# Patient Record
Sex: Female | Born: 1945
Health system: Southern US, Community
[De-identification: ages and names within clinical notes are randomized; demographics above are authoritative.]

## PROBLEM LIST (undated history)

## (undated) DIAGNOSIS — I10 Essential (primary) hypertension: Secondary | ICD-10-CM

## (undated) DIAGNOSIS — M1990 Unspecified osteoarthritis, unspecified site: Secondary | ICD-10-CM

---

## 2013-08-05 ENCOUNTER — Emergency Department (INDEPENDENT_AMBULATORY_CARE_PROVIDER_SITE_OTHER)
Admission: EM | Admit: 2013-08-05 | Discharge: 2013-08-05 | Disposition: A | Discharge status: Home / Self Care | Payer: Self-pay | Source: Home / Self Care | Attending: Family Medicine | Admitting: Family Medicine

## 2013-08-05 ENCOUNTER — Encounter (HOSPITAL_COMMUNITY): Payer: Self-pay | Admitting: Emergency Medicine

## 2013-08-05 DIAGNOSIS — IMO0002 Reserved for concepts with insufficient information to code with codable children: Secondary | ICD-10-CM

## 2013-08-05 DIAGNOSIS — M1710 Unilateral primary osteoarthritis, unspecified knee: Secondary | ICD-10-CM

## 2013-08-05 DIAGNOSIS — M1712 Unilateral primary osteoarthritis, left knee: Secondary | ICD-10-CM

## 2013-08-05 DIAGNOSIS — I10 Essential (primary) hypertension: Secondary | ICD-10-CM

## 2013-08-05 HISTORY — DX: Unspecified osteoarthritis, unspecified site: M19.90

## 2013-08-05 HISTORY — DX: Essential (primary) hypertension: I10

## 2013-08-05 LAB — POCT I-STAT, CHEM 8
BUN: 17 mg/dL (ref 6–23)
CHLORIDE: 101 meq/L (ref 96–112)
Calcium, Ion: 1.25 mmol/L (ref 1.13–1.30)
Creatinine, Ser: 0.9 mg/dL (ref 0.50–1.10)
GLUCOSE: 132 mg/dL — AB (ref 70–99)
HEMATOCRIT: 46 % (ref 36.0–46.0)
HEMOGLOBIN: 15.6 g/dL — AB (ref 12.0–15.0)
POTASSIUM: 3.9 meq/L (ref 3.7–5.3)
Sodium: 140 mEq/L (ref 137–147)
TCO2: 25 mmol/L (ref 0–100)

## 2013-08-05 MED ORDER — AMLODIPINE BESYLATE 10 MG PO TABS: 10.0000 mg | ORAL_TABLET | Freq: Every day | ORAL | Status: DC

## 2013-08-05 MED ORDER — LOSARTAN POTASSIUM-HCTZ 50-12.5 MG PO TABS: 1.0000 | ORAL_TABLET | Freq: Every day | ORAL | Status: DC

## 2013-08-05 MED ORDER — TRAMADOL HCL 50 MG PO TABS: 50.0000 mg | ORAL_TABLET | Freq: Two times a day (BID) | ORAL | Status: DC | PRN

## 2013-08-05 NOTE — ED Provider Notes (Signed)
Pamela Rocha is a 68 y.o. female who presents to Urgent Care today for hypertension and knee pain. Patient is a recent immigrant tinnitus states. She's been here approximately 2 months. She is here today to refill her blood pressure medications and to address her knee pain. 1) hypertension: Patient just ran out of losartan/hydrochlorothiazide 50/12 and amlodipine 10. She tolerated these medications well with no chest pains palpitations or shortness of breath. 2) patient has severe bilateral knee and low back pain. 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 She uses a walker ambulate. She notes inability to extend her knees fully over the last several years. She's tried ibuprofen which has not helped much.   Past Medical History  Diagnosis Date  . Hypertension   . Arthritis    History  Substance Use Topics  . Smoking status: Never Smoker   . Smokeless tobacco: Not on file  . Alcohol Use: Not on file   ROS as above Medications: No current facility-administered medications for this encounter.   Current Outpatient Prescriptions  Medication Sig Dispense Refill  . amLODipine (NORVASC) 10 MG tablet Take 1 tablet (10 mg total) by mouth daily.  30 tablet  1  . losartan-hydrochlorothiazide (HYZAAR) 50-12.5 MG per tablet Take 1 tablet by mouth daily.  30 tablet  1  . traMADol (ULTRAM) 50 MG tablet Take 1 tablet (50 mg total) by mouth every 12 (twelve) hours as needed.  60 tablet  1    Exam:  BP 158/86  Pulse 112  Temp(Src) 97.6 F (36.4 C) (Oral)  Resp 20  SpO2 97% Gen: Well NAD HEENT: EOMI,  MMM Lungs: Normal work of breathing. CTABL Heart: RRR no MRG Abd: NABS, Soft. NT, ND Exts: Brisk capillary refill, warm and well perfused.  Knees: Knees with mild effusion. Range of motion is limited. She lacks 10 of extension bilaterally.  She has pain on range of motion. Nontender.   Results for orders placed during the hospital encounter of 08/05/13  (from the past 24 hour(s))  POCT I-STAT, CHEM 8     Status: Abnormal   Collection Time    08/05/13 10:52 AM      Result Value Ref Range   Sodium 140  137 - 147 mEq/L   Potassium 3.9  3.7 - 5.3 mEq/L   Chloride 101  96 - 112 mEq/L   BUN 17  6 - 23 mg/dL   Creatinine, Ser 5.400.90  0.50 - 1.10 mg/dL   Glucose, Bld 981132 (*) 70 - 99 mg/dL   Calcium, Ion 1.911.25  4.781.13 - 1.30 mmol/L   TCO2 25  0 - 100 mmol/L   Hemoglobin 15.6 (*) 12.0 - 15.0 g/dL   HCT 29.546.0  62.136.0 - 30.846.0 %   No results found.  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 1) hypertension: Refill Hyzaar and amlodipine.  2) knee DJD: Quite significant appearing. No x-rays at this time. Patient has loss of range of motion. But his tramadol. Encourage followup with primary care provider in orthopedics for this issue. [facility]  Discussed warning signs or symptoms. Please see discharge instructions. Patient expresses understanding.    Rodolph BongEvan S Corey, MD 08/05/13 [phone]

## 2013-08-05 NOTE — Discharge Instructions (Signed)
Thank you for coming in today. Restart blood pressure medications.  Use tramadol sparingly for severe pain.  Schedule Tylenol 2 pills every 6 hours for pain.  Followup with primary care Dr. Soon.   Please call or see Ms Antionette CharMaggy Mena for assistance with your bill.  You may qualify for reduced or free services.  Her phone number is [phone]. Her email is [email or web address]    Arterial Hypertension Arterial hypertension (high blood pressure) is a condition of elevated pressure in your blood vessels. Hypertension over a long period of time is a risk factor for strokes, heart attacks, and heart failure. It is also the leading cause of kidney (renal) failure.  CAUSES   In Adults -- Over 90% of all hypertension has no known cause. This is called essential or primary hypertension. In the other 10% of people with hypertension, the increase in blood pressure is caused by another disorder. This is called secondary hypertension. Important causes of secondary hypertension are:  Heavy alcohol use.  Obstructive sleep apnea.  Hyperaldosterosim (Conn's syndrome).  Steroid use.  Chronic kidney failure.  Hyperparathyroidism.  Medications.  Renal artery stenosis.  Pheochromocytoma.  Cushing's disease.  Coarctation of the aorta.  Scleroderma renal crisis.  Licorice (in excessive amounts).  Drugs (cocaine, methamphetamine). Your caregiver can explain any items above that apply to you.  In Children -- Secondary hypertension is more common and should always be considered.  Pregnancy -- Few women of childbearing age have high blood pressure. However, up to 10% of them develop hypertension of pregnancy. Generally, this will not harm the woman. It may be a sign of 3 complications of pregnancy: preeclampsia, HELLP syndrome, and eclampsia. Follow up and control with medication is necessary. SYMPTOMS   This condition normally does not produce any noticeable symptoms. It is  usually found during a routine exam.  Malignant hypertension is a late problem of high blood pressure. It may have the following symptoms:  Headaches.  Blurred vision.  End-organ damage (this means your kidneys, heart, lungs, and other organs are being damaged).  Stressful situations can increase the blood pressure. If a person with normal blood pressure has their blood pressure go up while being seen by their caregiver, this is often termed "white coat hypertension." Its importance is not known. It may be related with eventually developing hypertension or complications of hypertension.  Hypertension is often confused with mental tension, stress, and anxiety. DIAGNOSIS  The diagnosis is made by 3 separate blood pressure measurements. They are taken at least 1 week apart from each other. If there is organ damage from hypertension, the diagnosis may be made without repeat measurements. Hypertension is usually identified by having blood pressure readings:  Above 140/90 mmHg measured in both arms, at 3 separate times, over a couple weeks.  Over 130/80 mmHg should be considered a risk factor and may require treatment in patients with diabetes. Blood pressure readings over 120/80 mmHg are called "pre-hypertension" even in non-diabetic patients. To get a true blood pressure measurement, use the following guidelines. Be aware of the factors that can alter blood pressure readings.  Take measurements at least 1 hour after caffeine.  Take measurements 30 minutes after smoking and without any stress. This is another reason to quit smoking  it raises your blood pressure.  Use a proper cuff size. Ask your caregiver if you are not sure about your cuff size.  Most home blood pressure cuffs are automatic. They will measure systolic and diastolic pressures. The systolic pressure is  the pressure reading at the start of sounds. Diastolic pressure is the pressure at which the sounds disappear. If you are  elderly, measure pressures in multiple postures. Try sitting, lying or standing.  Sit at rest for a minimum of 5 minutes before taking measurements.  You should not be on any medications like decongestants. These are found in many cold medications.  Record your blood pressure readings and review them with your caregiver. If you have hypertension:  Your caregiver may do tests to be sure you do not have secondary hypertension (see "causes" above).  Your caregiver may also look for signs of metabolic syndrome. This is also called Syndrome X or Insulin Resistance Syndrome. You may have this syndrome if you have type 2 diabetes, abdominal obesity, and abnormal blood lipids in addition to hypertension.  Your caregiver will take your medical and family history and perform a physical exam.  Diagnostic tests may include blood tests (for glucose, cholesterol, potassium, and kidney function), a urinalysis, or an EKG. Other tests may also be necessary depending on your condition. PREVENTION  There are important lifestyle issues that you can adopt to reduce your chance of developing hypertension:  Maintain a normal weight.  Limit the amount of salt (sodium) in your diet.  Exercise often.  Limit alcohol intake.  Get enough potassium in your diet. Discuss specific advice with your caregiver.  Follow a DASH diet (dietary approaches to stop hypertension). This diet is rich in fruits, vegetables, and low-fat dairy products, and avoids certain fats. PROGNOSIS  Essential hypertension cannot be cured. Lifestyle changes and medical treatment can lower blood pressure and reduce complications. The prognosis of secondary hypertension depends on the underlying cause. Many people whose hypertension is controlled with medicine or lifestyle changes can live a normal, healthy life.  RISKS AND COMPLICATIONS  While high blood pressure alone is not an illness, it often requires treatment due to its short- and  long-term effects on many organs. Hypertension increases your risk for:  CVAs or strokes (cerebrovascular accident).  Heart failure due to chronically high blood pressure (hypertensive cardiomyopathy).  Heart attack (myocardial infarction).  Damage to the retina (hypertensive retinopathy).  Kidney failure (hypertensive nephropathy). Your caregiver can explain list items above that apply to you. Treatment of hypertension can significantly reduce the risk of complications. TREATMENT   For overweight patients, weight loss and regular exercise are recommended. Physical fitness lowers blood pressure.  Mild hypertension is usually treated with diet and exercise. A diet rich in fruits and vegetables, fat-free dairy products, and foods low in fat and salt (sodium) can help lower blood pressure. Decreasing salt intake decreases blood pressure in a 1/3 of people.  Stop smoking if you are a smoker. The steps above are highly effective in reducing blood pressure. While these actions are easy to suggest, they are difficult to achieve. Most patients with moderate or severe hypertension end up requiring medications to bring their blood pressure down to a normal level. There are several classes of medications for treatment. Blood pressure pills (antihypertensives) will lower blood pressure by their different actions. Lowering the blood pressure by 10 mmHg may decrease the risk of complications by as much as 25%. The goal of treatment is effective blood pressure control. This will reduce your risk for complications. Your caregiver will help you determine the best treatment for you according to your lifestyle. What is excellent treatment for one person, may not be for you. HOME CARE INSTRUCTIONS   Do not smoke.  Follow  the lifestyle changes outlined in the "Prevention" section.  If you are on medications, follow the directions carefully. Blood pressure medications must be taken as prescribed. Skipping doses  reduces their benefit. It also puts you at risk for problems.  Follow up with your caregiver, as directed.  If you are asked to monitor your blood pressure at home, follow the guidelines in the "Diagnosis" section above. SEEK MEDICAL CARE IF:   You think you are having medication side effects.  You have recurrent headaches or lightheadedness.  You have swelling in your ankles.  You have trouble with your vision. SEEK IMMEDIATE MEDICAL CARE IF:   You have sudden onset of chest pain or pressure, difficulty breathing, or other symptoms of a heart attack.  You have a severe headache.  You have symptoms of a stroke (such as sudden weakness, difficulty speaking, difficulty walking). MAKE SURE YOU:   Understand these instructions.  Will watch your condition.  Will get help right away if you are not doing well or get worse. Document Released: 04/01/2005 Document Revised: 06/24/2011 Document Reviewed: 10/30/2006 [facility] Patient Information 2014 [place], [place].  Arthritis, Nonspecific Arthritis is inflammation of a joint. This usually means pain, redness, warmth or swelling are present. One or more joints may be involved. There are a number of types of arthritis. Your caregiver may not be able to tell what type of arthritis you have right away. CAUSES  The most common cause of arthritis is the wear and tear on the joint (osteoarthritis). This causes damage to the cartilage, which can break down over time. The knees, hips, back and neck are most often affected by this type of arthritis. Other types of arthritis and common causes of joint pain include:  Sprains and other injuries near the joint. Sometimes minor sprains and injuries cause pain and swelling that develop hours later.  Rheumatoid arthritis. This affects hands, feet and knees. It usually affects both sides of your body at the same time. It is often associated with chronic ailments, fever, weight loss and general  weakness.  Crystal arthritis. Gout and pseudo gout can cause occasional acute severe pain, redness and swelling in the foot, ankle, or knee.  Infectious arthritis. Bacteria can get into a joint through a break in overlying skin. This can cause infection of the joint. Bacteria and viruses can also spread through the blood and affect your joints.  Drug, infectious and allergy reactions. Sometimes joints can become mildly painful and slightly swollen with these types of illnesses. SYMPTOMS   Pain is the main symptom.  Your joint or joints can also be red, swollen and warm or hot to the touch.  You may have a fever with certain types of arthritis, or even feel overall ill.  The joint with arthritis will hurt with movement. Stiffness is present with some types of arthritis. DIAGNOSIS  Your caregiver will suspect arthritis based on your description of your symptoms and on your exam. Testing may be needed to find the type of arthritis:  Blood and sometimes urine tests.  X-ray tests and sometimes CT or MRI scans.  Removal of fluid from the joint (arthrocentesis) is done to check for bacteria, crystals or other causes. Your caregiver (or a specialist) will numb the area over the joint with a local anesthetic, and use a needle to remove joint fluid for examination. This procedure is only minimally uncomfortable.  Even with these tests, your caregiver may not be able to tell what kind of arthritis you have.  Consultation with a specialist (rheumatologist) may be helpful. TREATMENT  Your caregiver will discuss with you treatment specific to your type of arthritis. If the specific type cannot be determined, then the following general recommendations may apply. Treatment of severe joint pain includes:  Rest.  Elevation.  Anti-inflammatory medication (for example, ibuprofen) may be prescribed. Avoiding activities that cause increased pain.  Only take over-the-counter or prescription medicines for  pain and discomfort as recommended by your caregiver.  Cold packs over an inflamed joint may be used for 10 to 15 minutes every hour. Hot packs sometimes feel better, but do not use overnight. Do not use hot packs if you are diabetic without your caregiver's permission.  A cortisone shot into arthritic joints may help reduce pain and swelling.  Any acute arthritis that gets worse over the next 1 to 2 days needs to be looked at to be sure there is no joint infection. Long-term arthritis treatment involves modifying activities and lifestyle to reduce joint stress jarring. This can include weight loss. Also, exercise is needed to nourish the joint cartilage and remove waste. This helps keep the muscles around the joint strong. HOME CARE INSTRUCTIONS   Do not take aspirin to relieve pain if gout is suspected. This elevates uric acid levels.  Only take over-the-counter or prescription medicines for pain, discomfort or fever as directed by your caregiver.  Rest the joint as much as possible.  If your joint is swollen, keep it elevated.  Use crutches if the painful joint is in your leg.  Drinking plenty of fluids may help for certain types of arthritis.  Follow your caregiver's dietary instructions.  Try low-impact exercise such as:  Swimming.  Water aerobics.  Biking.  Walking.  Morning stiffness is often relieved by a warm shower.  Put your joints through regular range-of-motion. SEEK MEDICAL CARE IF:   You do not feel better in 24 hours or are getting worse.  You have side effects to medications, or are not getting better with treatment. SEEK IMMEDIATE MEDICAL CARE IF:   You have a fever.  You develop severe joint pain, swelling or redness.  Many joints are involved and become painful and swollen.  There is severe back pain and/or leg weakness.  You have loss of bowel or bladder control. Document Released: 05/09/2004 Document Revised: 06/24/2011 Document Reviewed:  05/25/2008 [facility] Patient Information 2014 [place], [place].

## 2013-10-04 ENCOUNTER — Encounter (HOSPITAL_COMMUNITY): Payer: Self-pay | Admitting: Emergency Medicine

## 2013-10-04 ENCOUNTER — Emergency Department (HOSPITAL_COMMUNITY): Payer: Self-pay

## 2013-10-04 ENCOUNTER — Emergency Department (HOSPITAL_COMMUNITY)
Admission: EM | Admit: 2013-10-04 | Discharge: 2013-10-04 | Disposition: A | Discharge status: Home / Self Care | Payer: Self-pay | Attending: Emergency Medicine | Admitting: Emergency Medicine

## 2013-10-04 DIAGNOSIS — M1712 Unilateral primary osteoarthritis, left knee: Secondary | ICD-10-CM

## 2013-10-04 DIAGNOSIS — M25562 Pain in left knee: Secondary | ICD-10-CM

## 2013-10-04 DIAGNOSIS — G8929 Other chronic pain: Secondary | ICD-10-CM | POA: Insufficient documentation

## 2013-10-04 DIAGNOSIS — M549 Dorsalgia, unspecified: Secondary | ICD-10-CM | POA: Insufficient documentation

## 2013-10-04 DIAGNOSIS — M5489 Other dorsalgia: Secondary | ICD-10-CM

## 2013-10-04 DIAGNOSIS — M1731 Unilateral post-traumatic osteoarthritis, right knee: Secondary | ICD-10-CM

## 2013-10-04 DIAGNOSIS — Z79899 Other long term (current) drug therapy: Secondary | ICD-10-CM | POA: Insufficient documentation

## 2013-10-04 DIAGNOSIS — IMO0002 Reserved for concepts with insufficient information to code with codable children: Secondary | ICD-10-CM | POA: Insufficient documentation

## 2013-10-04 DIAGNOSIS — G8911 Acute pain due to trauma: Secondary | ICD-10-CM | POA: Insufficient documentation

## 2013-10-04 DIAGNOSIS — I10 Essential (primary) hypertension: Secondary | ICD-10-CM | POA: Insufficient documentation

## 2013-10-04 DIAGNOSIS — M1710 Unilateral primary osteoarthritis, unspecified knee: Secondary | ICD-10-CM | POA: Insufficient documentation

## 2013-10-04 DIAGNOSIS — M25561 Pain in right knee: Secondary | ICD-10-CM

## 2013-10-04 MED ORDER — HYDROCODONE-ACETAMINOPHEN 5-325 MG PO TABS: 1.0000 | ORAL_TABLET | ORAL | Status: DC | PRN

## 2013-10-04 MED ORDER — LOSARTAN POTASSIUM 50 MG PO TABS
50.0000 mg | ORAL_TABLET | Freq: Once | ORAL | Status: AC
  Administered 2013-10-04: 50 mg via ORAL
  Filled 2013-10-04 (×2): qty 1

## 2013-10-04 MED ORDER — HYDROCODONE-ACETAMINOPHEN 5-325 MG PO TABS
1.0000 | ORAL_TABLET | Freq: Once | ORAL | Status: AC
  Administered 2013-10-04: 1 via ORAL
  Filled 2013-10-04: qty 1

## 2013-10-04 MED ORDER — LOSARTAN POTASSIUM-HCTZ 50-12.5 MG PO TABS: 1.0000 | ORAL_TABLET | Freq: Every day | ORAL | Status: DC

## 2013-10-04 MED ORDER — HYDROCHLOROTHIAZIDE 12.5 MG PO CAPS
12.5000 mg | ORAL_CAPSULE | Freq: Every day | ORAL | Status: DC
  Administered 2013-10-04: 12.5 mg via ORAL
  Filled 2013-10-04: qty 1

## 2013-10-04 MED ORDER — AMLODIPINE BESYLATE 10 MG PO TABS: 10.0000 mg | ORAL_TABLET | Freq: Every day | ORAL | Status: DC

## 2013-10-04 MED ORDER — AMLODIPINE BESYLATE 5 MG PO TABS
5.0000 mg | ORAL_TABLET | Freq: Every day | ORAL | Status: DC
  Administered 2013-10-04: 5 mg via ORAL
  Filled 2013-10-04: qty 1

## 2013-10-04 NOTE — Discharge Instructions (Signed)
Arthritis, Nonspecific °Arthritis is inflammation of a joint. This usually means pain, redness, warmth or swelling are present. One or more joints may be involved. There are a number of types of arthritis. Your caregiver may not be able to tell what type of arthritis you have right away. °CAUSES  °The most common cause of arthritis is the wear and tear on the joint (osteoarthritis). This causes damage to the cartilage, which can break down over time. The knees, hips, back and neck are most often affected by this type of arthritis. °Other types of arthritis and common causes of joint pain include: °· Sprains and other injuries near the joint. Sometimes minor sprains and injuries cause pain and swelling that develop hours later. °· Rheumatoid arthritis. This affects hands, feet and knees. It usually affects both sides of your body at the same time. It is often associated with chronic ailments, fever, weight loss and general weakness. °· Crystal arthritis. Gout and pseudo gout can cause occasional acute severe pain, redness and swelling in the foot, ankle, or knee. °· Infectious arthritis. Bacteria can get into a joint through a break in overlying skin. This can cause infection of the joint. Bacteria and viruses can also spread through the blood and affect your joints. °· Drug, infectious and allergy reactions. Sometimes joints can become mildly painful and slightly swollen with these types of illnesses. °SYMPTOMS  °· Pain is the main symptom. °· Your joint or joints can also be red, swollen and warm or hot to the touch. °· You may have a fever with certain types of arthritis, or even feel overall ill. °· The joint with arthritis will hurt with movement. Stiffness is present with some types of arthritis. °DIAGNOSIS  °Your caregiver will suspect arthritis based on your description of your symptoms and on your exam. Testing may be needed to find the type of arthritis: °· Blood and sometimes urine tests. °· X-ray tests  and sometimes CT or MRI scans. °· Removal of fluid from the joint (arthrocentesis) is done to check for bacteria, crystals or other causes. Your caregiver (or a specialist) will numb the area over the joint with a local anesthetic, and use a needle to remove joint fluid for examination. This procedure is only minimally uncomfortable. °· Even with these tests, your caregiver may not be able to tell what kind of arthritis you have. Consultation with a specialist (rheumatologist) may be helpful. °TREATMENT  °Your caregiver will discuss with you treatment specific to your type of arthritis. If the specific type cannot be determined, then the following general recommendations may apply. °Treatment of severe joint pain includes: °· Rest. °· Elevation. °· Anti-inflammatory medication (for example, ibuprofen) may be prescribed. Avoiding activities that cause increased pain. °· Only take over-the-counter or prescription medicines for pain and discomfort as recommended by your caregiver. °· Cold packs over an inflamed joint may be used for 10 to 15 minutes every hour. Hot packs sometimes feel better, but do not use overnight. Do not use hot packs if you are diabetic without your caregiver's permission. °· A cortisone shot into arthritic joints may help reduce pain and swelling. °· Any acute arthritis that gets worse over the next 1 to 2 days needs to be looked at to be sure there is no joint infection. °Long-term arthritis treatment involves modifying activities and lifestyle to reduce joint stress jarring. This can include weight loss. Also, exercise is needed to nourish the joint cartilage and remove waste. This helps keep the muscles   around the joint strong. °HOME CARE INSTRUCTIONS  °· Do not take aspirin to relieve pain if gout is suspected. This elevates uric acid levels. °· Only take over-the-counter or prescription medicines for pain, discomfort or fever as directed by your caregiver. °· Rest the joint as much as  possible. °· If your joint is swollen, keep it elevated. °· Use crutches if the painful joint is in your leg. °· Drinking plenty of fluids may help for certain types of arthritis. °· Follow your caregiver's dietary instructions. °· Try low-impact exercise such as: °¨ Swimming. °¨ Water aerobics. °¨ Biking. °¨ Walking. °· Morning stiffness is often relieved by a warm shower. °· Put your joints through regular range-of-motion. °SEEK MEDICAL CARE IF:  °· You do not feel better in 24 hours or are getting worse. °· You have side effects to medications, or are not getting better with treatment. °SEEK IMMEDIATE MEDICAL CARE IF:  °· You have a fever. °· You develop severe joint pain, swelling or redness. °· Many joints are involved and become painful and swollen. °· There is severe back pain and/or leg weakness. °· You have loss of bowel or bladder control. °Document Released: 05/09/2004 Document Revised: 06/24/2011 Document Reviewed: 05/25/2008 °[employer]® Patient Information ©2015 [employer]. This information is not intended to replace advice given to you by your health care provider. Make sure you discuss any questions you have with your health care provider. ° °

## 2013-10-04 NOTE — ED Notes (Signed)
Pt here for back pain , hx of same and hx of arthritis, per daughter with pt has difficulty walking and getting out of bed,

## 2013-10-04 NOTE — ED Provider Notes (Signed)
CSN: [id number]     Arrival date & time 10/04/13  1029 History   First MD Initiated Contact with Patient 10/04/13 1129     Chief Complaint  Patient presents with  . Back Pain     (Consider location/radiation/quality/duration/timing/severity/associated sxs/prior Treatment) HPI  Pamela Rocha is a 68 y.o. female who is here for worsening of chronic back pain. Worsening. Pain has been present for 3 days and is not responding to Ultram. The pain is in the low back and radiates to both eyes. There's been no bowel or urinary incontinence, fever, chills, nausea, vomiting, weakness, or dizziness. She ran out of her blood pressure medicines 2 days ago. Her appointment with the primary care doctor is in 2 weeks. No other known modifying factors.   Past Medical History  Diagnosis Date  . Hypertension   . Arthritis    History reviewed. No pertinent past surgical history. History reviewed. No pertinent family history. History  Substance Use Topics  . Smoking status: Never Smoker   . Smokeless tobacco: Not on file  . Alcohol Use: Not on file   OB History   Grav Para Term Preterm Abortions TAB SAB Ect Mult Living                 Review of Systems  All other systems reviewed and are negative.     Allergies  Review of patient's allergies indicates no known allergies.  Home Medications   Prior to Admission medications   Medication Sig Start Date End Date Taking? Authorizing Provider  amLODipine (NORVASC) 10 MG tablet Take 1 tablet (10 mg total) by mouth daily. 08/05/13  Yes Rodolph Bong, MD  Glucosamine HCl (GLUCOSAMINE PO) Take 1 tablet by mouth 2 (two) times daily.   Yes Historical Provider, MD  losartan-hydrochlorothiazide (HYZAAR) 50-12.5 MG per tablet Take 1 tablet by mouth daily. 08/05/13  Yes Rodolph Bong, MD  traMADol (ULTRAM) 50 MG tablet Take 1 tablet (50 mg total) by mouth every 12 (twelve) hours as needed. 08/05/13  Yes Rodolph Bong, MD  amLODipine (NORVASC) 10 MG tablet  Take 1 tablet (10 mg total) by mouth daily. 10/04/13   Flint Melter, MD  HYDROcodone-acetaminophen (NORCO) 5-325 MG per tablet Take 1 tablet by mouth every 4 (four) hours as needed. 10/04/13   Flint Melter, MD  losartan-hydrochlorothiazide (HYZAAR) 50-12.5 MG per tablet Take 1 tablet by mouth daily. 10/04/13   Flint Melter, MD   BP 127/77  Pulse 69  Temp(Src) 97.8 F (36.6 C) (Oral)  Resp 16  Ht 5\' 5"  (1.651 m)  Wt 216 lb (97.977 kg)  BMI 35.94 kg/m2  SpO2 93% Physical Exam  Nursing note and vitals reviewed. Constitutional: She is oriented to person, place, and time. She appears well-developed.  Elderly, frail  HENT:  Head: Normocephalic and atraumatic.  Eyes: Conjunctivae and EOM are normal. Pupils are equal, round, and reactive to light.  Neck: Normal range of motion and phonation normal. Neck supple.  Cardiovascular: Normal rate, regular rhythm and intact distal pulses.   Pulmonary/Chest: Effort normal and breath sounds normal. She exhibits no tenderness.  Abdominal: Soft. She exhibits no distension. There is no tenderness. There is no guarding.  Musculoskeletal: Normal range of motion.  Normal lumbar motion. Mild lumbar spine and lumbar paravertebral spine tenderness.   Neurological: She is alert and oriented to person, place, and time. She exhibits normal muscle tone.  Skin: Skin is warm and dry.  Psychiatric: She has a  normal mood and affect. Her behavior is normal. Judgment and thought content normal.    ED Course  Procedures (including critical care time)  Medications  hydrochlorothiazide (MICROZIDE) capsule 12.5 mg (12.5 mg Oral Given 10/04/13 1159)  amLODipine (NORVASC) tablet 5 mg (5 mg Oral Given 10/04/13 1159)  losartan (COZAAR) tablet 50 mg (50 mg Oral Given 10/04/13 1352)  HYDROcodone-acetaminophen (NORCO/VICODIN) 5-325 MG per tablet 1 tablet (1 tablet Oral Given 10/04/13 1159)    Patient Vitals for the past 24 hrs:  BP Temp Temp src Pulse Resp SpO2 Height  Weight  10/04/13 1446 127/77 mmHg - - 69 16 93 % - -  10/04/13 1430 105/64 mmHg - - 66 - 93 % - -  10/04/13 1400 113/76 mmHg - - - - - - -  10/04/13 1330 81/61 mmHg - - - - - - -  10/04/13 1315 103/61 mmHg - - - - - - -  10/04/13 1159 122/72 mmHg - - - - - - -  10/04/13 1145 122/72 mmHg - - 75 21 91 % - -  10/04/13 1139 106/82 mmHg 97.8 F (36.6 C) Oral 76 19 92 % - -  10/04/13 1130 122/73 mmHg - - 79 18 94 % - -  10/04/13 1115 135/63 mmHg 98.2 F (36.8 C) Oral 84 18 96 % 5\' 5"  (1.651 m) 216 lb (97.977 kg)    At discharge- Reevaluation with update and discussion. After initial assessment and treatment, an updated evaluation reveals she is comfortable. I discussed with patient, and daughter.Mancel Bale. Lashay Osborne L   Labs Review Labs Reviewed - No data to display  Imaging Review Dg Lumbar Spine Complete  10/04/2013   CLINICAL DATA:  Lower back pain.  EXAM: LUMBAR SPINE - COMPLETE 4+ VIEW  COMPARISON:  None.  FINDINGS: No fracture is noted. Minimal grade 1 anterolisthesis of L4-5 is noted secondary to degenerative disc disease at this level. Severe degenerative disc disease is noted at L3-4.  IMPRESSION: Multilevel degenerative disc disease. No acute abnormality seen in the lumbar spine.   Electronically Signed   By: Roque LiasJames  Green M.D.   On: 10/04/2013 13:17   Dg Knee Complete 4 Views Left  10/04/2013   CLINICAL DATA:  Knee pain for 3 days.  EXAM: LEFT KNEE - COMPLETE 4+ VIEW  COMPARISON:  None.  FINDINGS: The bones appear osteopenic. No acute fracture, dislocation, or knee joint effusion is identified. There is tricompartmental osteophytosis, with greatest involvement of the medial compartment. There is mild tricompartmental joint space narrowing.  IMPRESSION: No acute osseous abnormality identified. 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   Electronically Signed   By: Sebastian AcheAllen  Grady   On: 10/04/2013 13:38   Dg Knee Complete 4 Views Right  10/04/2013   CLINICAL DATA:  Back pain, knee  pain.  EXAM: RIGHT KNEE - COMPLETE 4+ VIEW  COMPARISON:  None.  FINDINGS: Advanced degenerative joint disease changes within the right knee in all 3 compartments. No acute bony abnormality. Specifically, no fracture, subluxation, or dislocation. Soft tissues are intact. No joint effusion.  IMPRESSION: Advanced tricompartment osteoarthritis.  No acute findings.   Electronically Signed   By: Charlett NoseKevin  Dover M.D.   On: 10/04/2013 13:16     EKG Interpretation None      MDM   Final diagnoses:  Midline back pain, unspecified location  Arthralgia of both knees  Primary osteoarthritis of left knee  Post-traumatic osteoarthritis of right knee    Nonspecific back and knee pain with evidence for degenerative  joint disease. Patient is also out of her blood pressure medication and required refill.  Nursing Notes Reviewed/ Care Coordinated Applicable Imaging Reviewed Interpretation of Laboratory Data incorporated into ED treatment  The patient appears reasonably screened and/or stabilized for discharge and I doubt any other medical condition or other [facility] requiring further screening, evaluation, or treatment in the ED at this time prior to discharge.  Plan: Home Medications- usual, Norco; Home Treatments- rest, heat; return here if the recommended treatment, does not improve the symptoms; Recommended follow up- PCP, for check up in 1 week.   Flint MelterElliott L Kimm Ungaro, MD 10/04/13 [phone]

## 2013-10-19 ENCOUNTER — Encounter: Payer: Self-pay | Admitting: Internal Medicine

## 2013-10-19 ENCOUNTER — Ambulatory Visit: Payer: Self-pay | Attending: Internal Medicine | Admitting: Internal Medicine

## 2013-10-19 VITALS — BP 113/75 | HR 81 | Temp 97.8°F | Resp 16 | Wt 203.2 lb

## 2013-10-19 DIAGNOSIS — M129 Arthropathy, unspecified: Secondary | ICD-10-CM | POA: Insufficient documentation

## 2013-10-19 DIAGNOSIS — M545 Low back pain, unspecified: Secondary | ICD-10-CM | POA: Insufficient documentation

## 2013-10-19 DIAGNOSIS — Z139 Encounter for screening, unspecified: Secondary | ICD-10-CM

## 2013-10-19 DIAGNOSIS — Z1211 Encounter for screening for malignant neoplasm of colon: Secondary | ICD-10-CM | POA: Insufficient documentation

## 2013-10-19 DIAGNOSIS — I10 Essential (primary) hypertension: Secondary | ICD-10-CM | POA: Insufficient documentation

## 2013-10-19 DIAGNOSIS — M1990 Unspecified osteoarthritis, unspecified site: Secondary | ICD-10-CM

## 2013-10-19 DIAGNOSIS — G8929 Other chronic pain: Secondary | ICD-10-CM | POA: Insufficient documentation

## 2013-10-19 LAB — COMPLETE METABOLIC PANEL WITH GFR
ALBUMIN: 3.9 g/dL (ref 3.5–5.2)
ALT: 12 U/L (ref 0–35)
AST: 17 U/L (ref 0–37)
Alkaline Phosphatase: 73 U/L (ref 39–117)
BUN: 15 mg/dL (ref 6–23)
CALCIUM: 9.9 mg/dL (ref 8.4–10.5)
CHLORIDE: 100 meq/L (ref 96–112)
CO2: 29 meq/L (ref 19–32)
Creat: 0.87 mg/dL (ref 0.50–1.10)
GFR, EST AFRICAN AMERICAN: 78 mL/min
GFR, EST NON AFRICAN AMERICAN: 68 mL/min
GLUCOSE: 137 mg/dL — AB (ref 70–99)
POTASSIUM: 3.5 meq/L (ref 3.5–5.3)
SODIUM: 140 meq/L (ref 135–145)
TOTAL PROTEIN: 7.3 g/dL (ref 6.0–8.3)
Total Bilirubin: 0.6 mg/dL (ref 0.2–1.2)

## 2013-10-19 LAB — TSH: TSH: 0.55 u[IU]/mL (ref 0.350–4.500)

## 2013-10-19 LAB — CBC WITH DIFFERENTIAL/PLATELET
Basophils Absolute: 0 10*3/uL (ref 0.0–0.1)
Basophils Relative: 0 % (ref 0–1)
Eosinophils Absolute: 0.1 10*3/uL (ref 0.0–0.7)
Eosinophils Relative: 1 % (ref 0–5)
HEMATOCRIT: 40.4 % (ref 36.0–46.0)
Hemoglobin: 14.1 g/dL (ref 12.0–15.0)
LYMPHS ABS: 2 10*3/uL (ref 0.7–4.0)
LYMPHS PCT: 25 % (ref 12–46)
MCH: 30.9 pg (ref 26.0–34.0)
MCHC: 34.9 g/dL (ref 30.0–36.0)
MCV: 88.6 fL (ref 78.0–100.0)
MONO ABS: 0.4 10*3/uL (ref 0.1–1.0)
MONOS PCT: 5 % (ref 3–12)
NEUTROS ABS: 5.5 10*3/uL (ref 1.7–7.7)
NEUTROS PCT: 69 % (ref 43–77)
Platelets: 366 10*3/uL (ref 150–400)
RBC: 4.56 MIL/uL (ref 3.87–5.11)
RDW: 14.3 % (ref 11.5–15.5)
WBC: 7.9 10*3/uL (ref 4.0–10.5)

## 2013-10-19 LAB — LIPID PANEL
Cholesterol: 209 mg/dL — ABNORMAL HIGH (ref 0–200)
HDL: 50 mg/dL (ref >39)
LDL CALC: 132 mg/dL — AB (ref 0–99)
Total CHOL/HDL Ratio: 4.2 Ratio
Triglycerides: 135 mg/dL (ref <150)
VLDL: 27 mg/dL (ref 0–40)

## 2013-10-19 MED ORDER — LOSARTAN POTASSIUM-HCTZ 50-12.5 MG PO TABS: 1.0000 | ORAL_TABLET | Freq: Every day | ORAL | Status: DC

## 2013-10-19 MED ORDER — AMLODIPINE BESYLATE 10 MG PO TABS: 10.0000 mg | ORAL_TABLET | Freq: Every day | ORAL | Status: DC

## 2013-10-19 MED ORDER — TRAMADOL HCL 50 MG PO TABS: 50.0000 mg | ORAL_TABLET | Freq: Two times a day (BID) | ORAL | Status: DC | PRN

## 2013-10-19 NOTE — Patient Instructions (Signed)
DASH Eating Plan  DASH stands for "Dietary Approaches to Stop Hypertension." The DASH eating plan is a healthy eating plan that has been shown to reduce high blood pressure (hypertension). Additional health benefits may include reducing the risk of type 2 diabetes mellitus, heart disease, and stroke. The DASH eating plan may also help with weight loss.  WHAT DO I NEED TO KNOW ABOUT THE DASH EATING PLAN?  For the DASH eating plan, you will follow these general guidelines:  · Choose foods with a percent daily value for sodium of less than 5% (as listed on the food label).  · Use salt-free seasonings or herbs instead of table salt or sea salt.  · Check with your health care provider or pharmacist before using salt substitutes.  · Eat lower-sodium products, often labeled as "lower sodium" or "no salt added."  · Eat fresh foods.  · Eat more vegetables, fruits, and low-fat dairy products.  · Choose whole grains. Look for the word "whole" as the first word in the ingredient list.  · Choose fish and skinless chicken or turkey more often than red meat. Limit fish, poultry, and meat to 6 oz (170 g) each day.  · Limit sweets, desserts, sugars, and sugary drinks.  · Choose heart-healthy fats.  · Limit cheese to 1 oz (28 g) per day.  · Eat more home-cooked food and less restaurant, buffet, and fast food.  · Limit fried foods.  · Cook foods using methods other than frying.  · Limit canned vegetables. If you do use them, rinse them well to decrease the sodium.  · When eating at a restaurant, ask that your food be prepared with less salt, or no salt if possible.  WHAT FOODS CAN I EAT?  Seek help from a dietitian for individual calorie needs.  Grains  Whole grain or whole wheat bread. Brown rice. Whole grain or whole wheat pasta. Quinoa, bulgur, and whole grain cereals. Low-sodium cereals. Corn or whole wheat flour tortillas. Whole grain cornbread. Whole grain crackers. Low-sodium crackers.  Vegetables  Fresh or frozen vegetables  (raw, steamed, roasted, or grilled). Low-sodium or reduced-sodium tomato and vegetable juices. Low-sodium or reduced-sodium tomato sauce and paste. Low-sodium or reduced-sodium canned vegetables.   Fruits  All fresh, canned (in natural juice), or frozen fruits.  Meat and Other Protein Products  Ground beef (85% or leaner), grass-fed beef, or beef trimmed of fat. Skinless chicken or turkey. Ground chicken or turkey. Pork trimmed of fat. All fish and seafood. Eggs. Dried beans, peas, or lentils. Unsalted nuts and seeds. Unsalted canned beans.  Dairy  Low-fat dairy products, such as skim or 1% milk, 2% or reduced-fat cheeses, low-fat ricotta or cottage cheese, or plain low-fat yogurt. Low-sodium or reduced-sodium cheeses.  Fats and Oils  Tub margarines without trans fats. Light or reduced-fat mayonnaise and salad dressings (reduced sodium). Avocado. Safflower, olive, or canola oils. Natural peanut or almond butter.  Other  Unsalted popcorn and pretzels.  The items listed above may not be a complete list of recommended foods or beverages. Contact your dietitian for more options.  WHAT FOODS ARE NOT RECOMMENDED?  Grains  White bread. White pasta. White rice. Refined cornbread. Bagels and croissants. Crackers that contain trans fat.  Vegetables  Creamed or fried vegetables. Vegetables in a cheese sauce. Regular canned vegetables. Regular canned tomato sauce and paste. Regular tomato and vegetable juices.  Fruits  Dried fruits. Canned fruit in light or heavy syrup. Fruit juice.  Meat and Other Protein   Products  Fatty cuts of meat. Ribs, chicken wings, bacon, sausage, bologna, salami, chitterlings, fatback, hot dogs, bratwurst, and packaged luncheon meats. Salted nuts and seeds. Canned beans with salt.  Dairy  Whole or 2% milk, cream, half-and-half, and cream cheese. Whole-fat or sweetened yogurt. Full-fat cheeses or blue cheese. Nondairy creamers and whipped toppings. Processed cheese, cheese spreads, or cheese  curds.  Condiments  Onion and garlic salt, seasoned salt, table salt, and sea salt. Canned and packaged gravies. Worcestershire sauce. Tartar sauce. Barbecue sauce. Teriyaki sauce. Soy sauce, including reduced sodium. Steak sauce. Fish sauce. Oyster sauce. Cocktail sauce. Horseradish. Ketchup and mustard. Meat flavorings and tenderizers. Bouillon cubes. Hot sauce. Tabasco sauce. Marinades. Taco seasonings. Relishes.  Fats and Oils  Butter, stick margarine, lard, shortening, ghee, and bacon fat. Coconut, palm kernel, or palm oils. Regular salad dressings.  Other  Pickles and olives. Salted popcorn and pretzels.  The items listed above may not be a complete list of foods and beverages to avoid. Contact your dietitian for more information.  WHERE CAN I FIND MORE INFORMATION?  National Heart, Lung, and [employer] Institute: www.nhlbi.nih.gov/health/health-topics/topics/dash/  Document Released: 03/21/2011 Document Revised: 04/06/2013 Document Reviewed: 02/03/2013  ExitCare® Patient Information ©2015 [employer]. This information is not intended to replace advice given to you by your health care provider. Make sure you discuss any questions you have with your health care provider.

## 2013-10-19 NOTE — Progress Notes (Signed)
Patient here to establish care Taking medication for hypertension

## 2013-10-19 NOTE — Progress Notes (Signed)
Patient Demographics  Pamela Rocha, is a [age] y.o. female  ZOX:[id number]:[id number]  [id number]:[id number]  DOB - 11/07/1945  CC:  Chief Complaint  Patient presents with  . Establish Care       HPI: Pamela Rocha is a 68 y.o. female here today to establish medical care. Patient has history of hypertension, osteoarthritis both knees, chronic back pain DJD, patient currently requesting refill on her medication as well as she is following up with orthopedics and is scheduled to get surgery next month, she denies any acute symptoms denies any headache dizziness chest and shortness of breath, denies any cardiac problems in the past, denies smoking cigarettes does not take any over-the-counter medications. Patient asked tramadol when necessary for pain. Patient has No headache, No chest pain, No abdominal pain - No Nausea, No new weakness tingling or numbness, No Cough - SOB.  No Known Allergies Past Medical History  Diagnosis Date  . Hypertension   . Arthritis    Current Outpatient Prescriptions on File Prior to Visit  Medication Sig Dispense Refill  . amLODipine (NORVASC) 10 MG tablet Take 1 tablet (10 mg total) by mouth daily.  30 tablet  3  . Glucosamine HCl (GLUCOSAMINE PO) Take 1 tablet by mouth 2 (two) times daily.      Marland Kitchen. HYDROcodone-acetaminophen (NORCO) 5-325 MG per tablet Take 1 tablet by mouth every 4 (four) hours as needed.  30 tablet  0  . losartan-hydrochlorothiazide (HYZAAR) 50-12.5 MG per tablet Take 1 tablet by mouth daily.  30 tablet  3   No current facility-administered medications on file prior to visit.   History reviewed. No pertinent family history. History   Social History  . Marital Status: Widowed    Spouse Name: N/A    Number of Children: N/A  . Years of Education: N/A   Occupational History  . Not on file.   Social History Main Topics  . Smoking status: Never Smoker   . Smokeless tobacco: Not on file  . Alcohol Use: No  . Drug Use: Not on file  . Sexual  Activity: Not on file   Other Topics Concern  . Not on file   Social History Narrative  . No narrative on file    Review of Systems: Constitutional: Negative for fever, chills, diaphoresis, activity change, appetite change and fatigue. HENT: Negative for ear pain, nosebleeds, congestion, facial swelling, rhinorrhea, neck pain, neck stiffness and ear discharge.  Eyes: Negative for pain, discharge, redness, itching and visual disturbance. Respiratory: Negative for cough, choking, chest tightness, shortness of breath, wheezing and stridor.  Cardiovascular: Negative for chest pain, palpitations and leg swelling. Gastrointestinal: Negative for abdominal distention. Genitourinary: Negative for dysuria, urgency, frequency, hematuria, flank pain, decreased urine volume, difficulty urinating and dyspareunia.  Musculoskeletal: Negative for back pain, joint swelling, arthralgia and gait problem. Neurological: Negative for dizziness, tremors, seizures, syncope, facial asymmetry, speech difficulty, weakness, light-headedness, numbness and headaches.  Hematological: Negative for adenopathy. Does not bruise/bleed easily. Psychiatric/Behavioral: Negative for hallucinations, behavioral problems, confusion, dysphoric mood, decreased concentration and agitation.    Objective:   Filed Vitals:   10/19/13 1018  BP: 113/75  Pulse: 81  Temp: 97.8 F (36.6 C)  Resp: 16    Physical Exam: Constitutional: Patient appears well-developed and well-nourished. No distress. HENT: Normocephalic, atraumatic, External right and left ear normal. Oropharynx is clear and moist.  Eyes: Conjunctivae and EOM are normal. PERRLA, no scleral icterus. Neck: Normal ROM. Neck supple. No JVD. No tracheal deviation. No  thyromegaly. CVS: RRR, S1/S2 +, no murmurs, no gallops, no carotid bruit.  Pulmonary: Effort and breath sounds normal, no stridor, rhonchi, wheezes, rales.  Abdominal: Soft. BS +, no distension, tenderness,  rebound or guarding.  Musculoskeletal: bilateral knees some tenderness crepitation+, lower lumbar paraspinal tenderness  Lymphadenopathy: No lymphadenopathy noted, cervical, inguinal or axillary Neuro: Alert. Normal reflexes, muscle tone coordination. No cranial nerve deficit. Skin: Skin is warm and dry. No rash noted. Not diaphoretic. No erythema. No pallor. Psychiatric: Normal mood and affect. Behavior, judgment, thought content normal.  Lab Results  Component Value Date   HGB 15.6* 08/05/2013   HCT 46.0 08/05/2013   Lab Results  Component Value Date   CREATININE 0.90 08/05/2013   BUN 17 08/05/2013   NA 140 08/05/2013   K 3.9 08/05/2013   CL 101 08/05/2013    No results found for this basename: HGBA1C   Lipid Panel  No results found for this basename: chol, trig, hdl, cholhdl, vldl, ldlcalc       Assessment and plan:   1. Essential hypertension, benign Blood pressure is well controlled continue with her current medications. - amLODipine (NORVASC) 10 MG tablet; Take 1 tablet (10 mg total) by mouth daily.  Dispense: 30 tablet; Refill: 2 - losartan-hydrochlorothiazide (HYZAAR) 50-12.5 MG per tablet; Take 1 tablet by mouth daily.  Dispense: 30 tablet; Refill: 2  2. Arthritis Patient is following up with her orthopedics. - traMADol (ULTRAM) 50 MG tablet; Take 1 tablet (50 mg total) by mouth every 12 (twelve) hours as needed.  Dispense: 60 tablet; Refill: 1  3. Screening I have ordered baseline fasting blood work. - CBC with Differential - COMPLETE METABOLIC PANEL WITH GFR - TSH - Lipid panel - Vit D  25 hydroxy (rtn osteoporosis monitoring)  4. Special screening for malignant neoplasms, colon  - Ambulatory referral to Gastroenterology  5. Chronic low back pain Tramadol when necessary.      Health Maintenance -Colonoscopy: referred to GI   Return in about 3 months (around 01/19/2014) for hypertension, osteoarthritis.       Doris CheadleADVANI, Pamela Wigington, MD

## 2013-10-20 ENCOUNTER — Telehealth: Payer: Self-pay

## 2013-10-20 LAB — VITAMIN D 25 HYDROXY (VIT D DEFICIENCY, FRACTURES): VIT D 25 HYDROXY: 23 ng/mL — AB (ref 30–89)

## 2013-10-20 MED ORDER — VITAMIN D (ERGOCALCIFEROL) 1.25 MG (50000 UNIT) PO CAPS: 50000.0000 [IU] | ORAL_CAPSULE | ORAL | Status: DC

## 2013-10-20 NOTE — Telephone Encounter (Signed)
Interpreter line used Patient not available Left message on voice mail to return our call 

## 2013-10-20 NOTE — Telephone Encounter (Signed)
Message copied by Lestine MountJUAREZ, Shenia Alan L on Wed Oct 20, 2013 12:12 PM ------      Message from: Doris CheadleADVANI, DEEPAK      Created: Wed Oct 20, 2013  9:24 AM       Blood work reviewed, noticed low vitamin D, call patient advise to start ergocalciferol 50,000 units once a week for the duration of  12 weeks.      Blood work reviewed noticed impaired fasting glucose, call and advise patient for low carbohydrate diet.       ------

## 2013-10-28 ENCOUNTER — Encounter: Payer: Self-pay | Admitting: Internal Medicine

## 2013-11-17 ENCOUNTER — Other Ambulatory Visit (HOSPITAL_COMMUNITY): Payer: Self-pay

## 2013-11-26 ENCOUNTER — Inpatient Hospital Stay (HOSPITAL_COMMUNITY): Admission: RE | Admit: 2013-11-26 | Payer: Self-pay | Source: Ambulatory Visit | Admitting: Orthopedic Surgery

## 2013-11-26 ENCOUNTER — Encounter (HOSPITAL_COMMUNITY): Admission: RE | Payer: Self-pay | Source: Ambulatory Visit

## 2013-11-26 SURGERY — ARTHROPLASTY, KNEE, BILATERAL, TOTAL
Anesthesia: General | Laterality: Bilateral

## 2013-12-21 ENCOUNTER — Encounter: Payer: Self-pay | Admitting: Internal Medicine

## 2014-07-26 ENCOUNTER — Telehealth: Payer: Self-pay | Admitting: Internal Medicine

## 2014-07-26 NOTE — Telephone Encounter (Signed)
Pt had blood work done and has not received a call about the results. Please follow up with Daughter Bruce Donath(Elimelda Ongeri). Pt speaks Swahili.

## 2014-08-05 ENCOUNTER — Telehealth: Payer: Self-pay

## 2014-08-05 NOTE — Telephone Encounter (Signed)
Spoke with daughter in law due to language barrier Daughter in law is aware of the lab results Patient should have been taking vitamin D but prescription was sent  Almost nine months ago i informed her that she need another appointment so we can do blood work

## 2014-10-24 IMAGING — CR DG LUMBAR SPINE COMPLETE 4+V
7 series · 7 of 7 positions shown · non-contrast
Comparison: None.

CLINICAL DATA: Lower back pain.

EXAM:
LUMBAR SPINE - COMPLETE 4+ VIEW

[t lumbar spine ap]
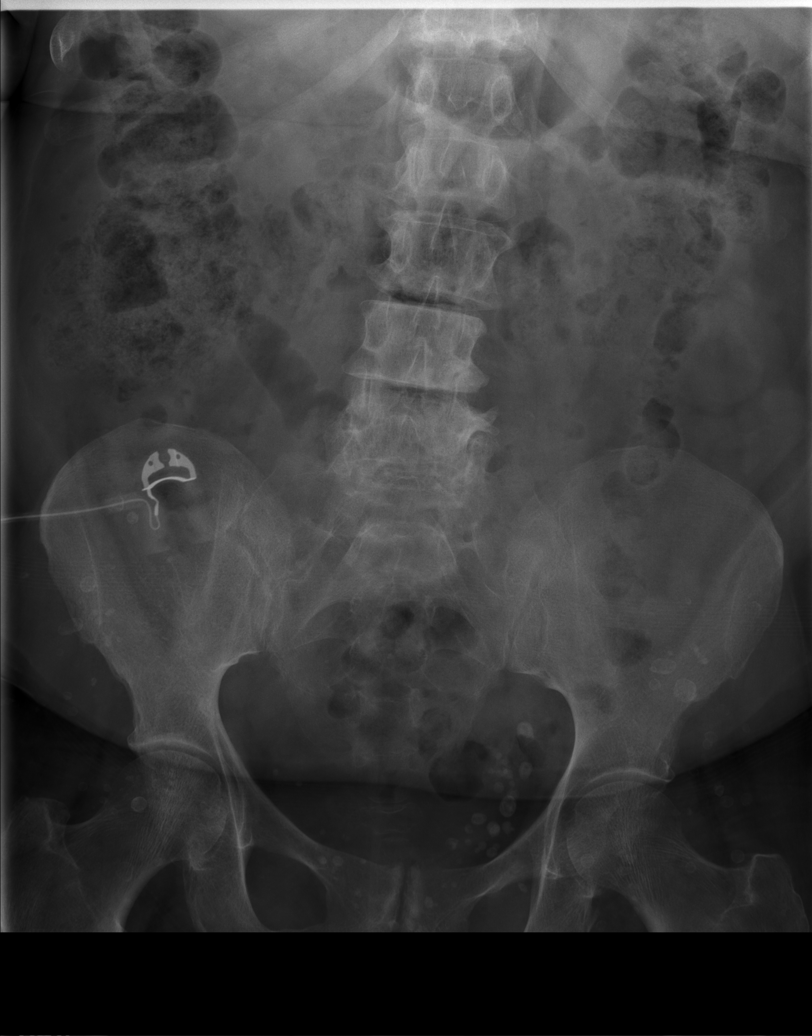

[t lumbar spine obl (1 of 4)]
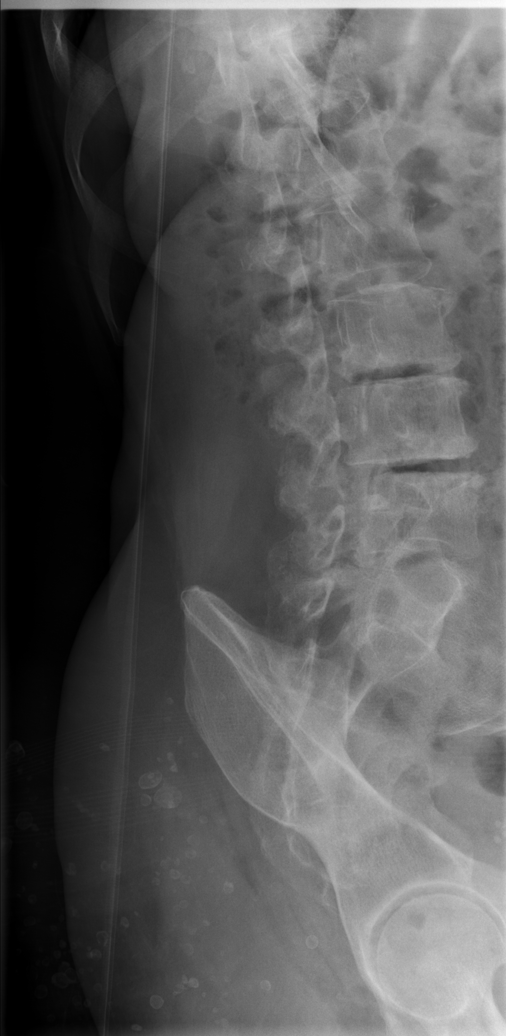

[t lumbar spine lat]
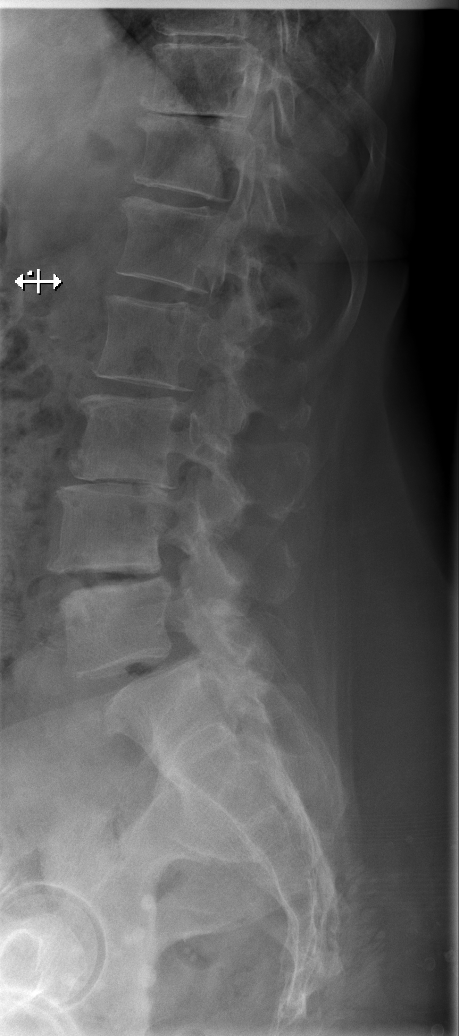

[t lumbar spine obl (2 of 4)]
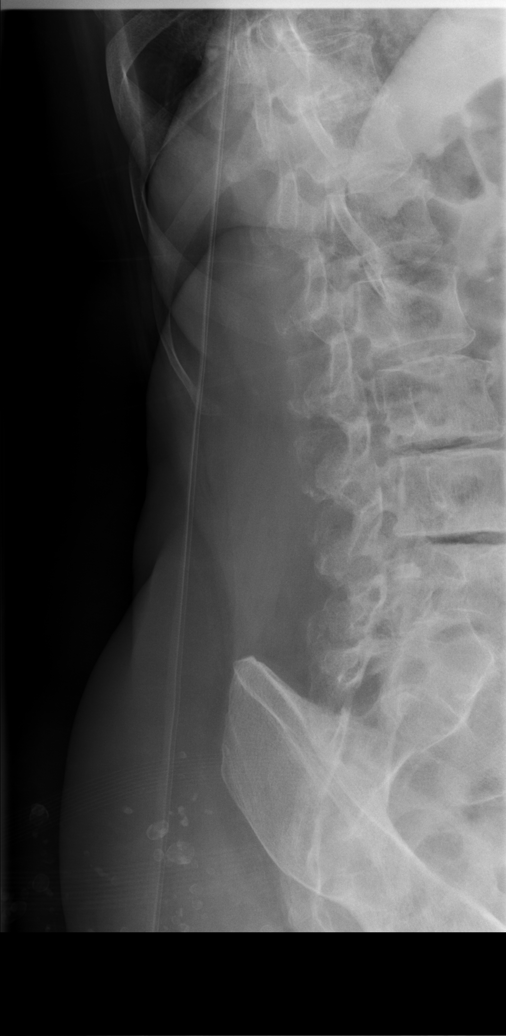

[t lumbar spine obl (3 of 4)]
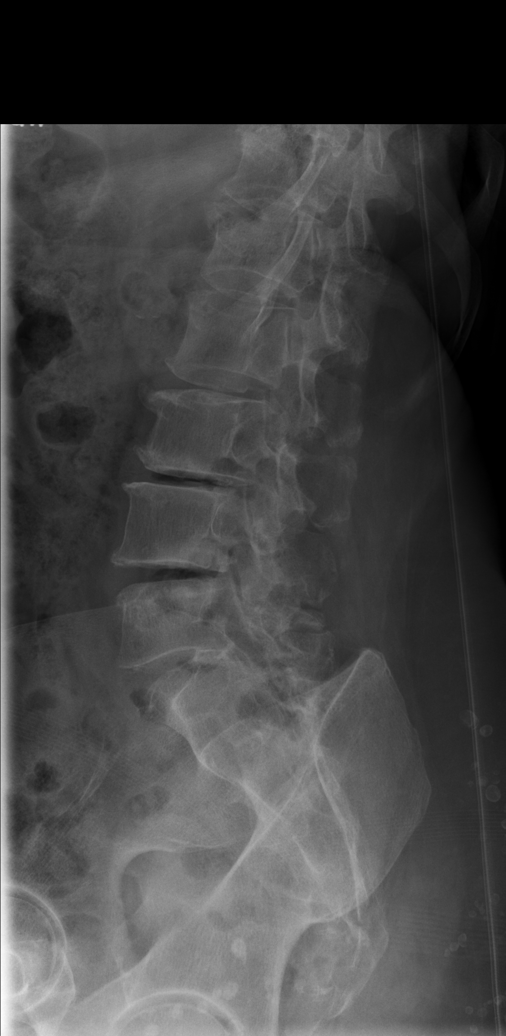

[t lumbar spine obl (4 of 4)]
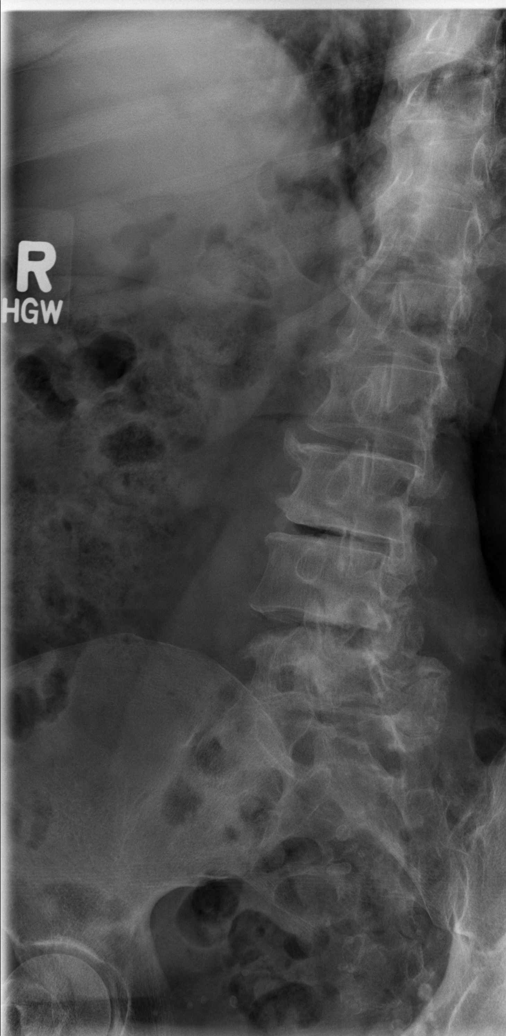

[t lumbar l-5 s-1 spot]
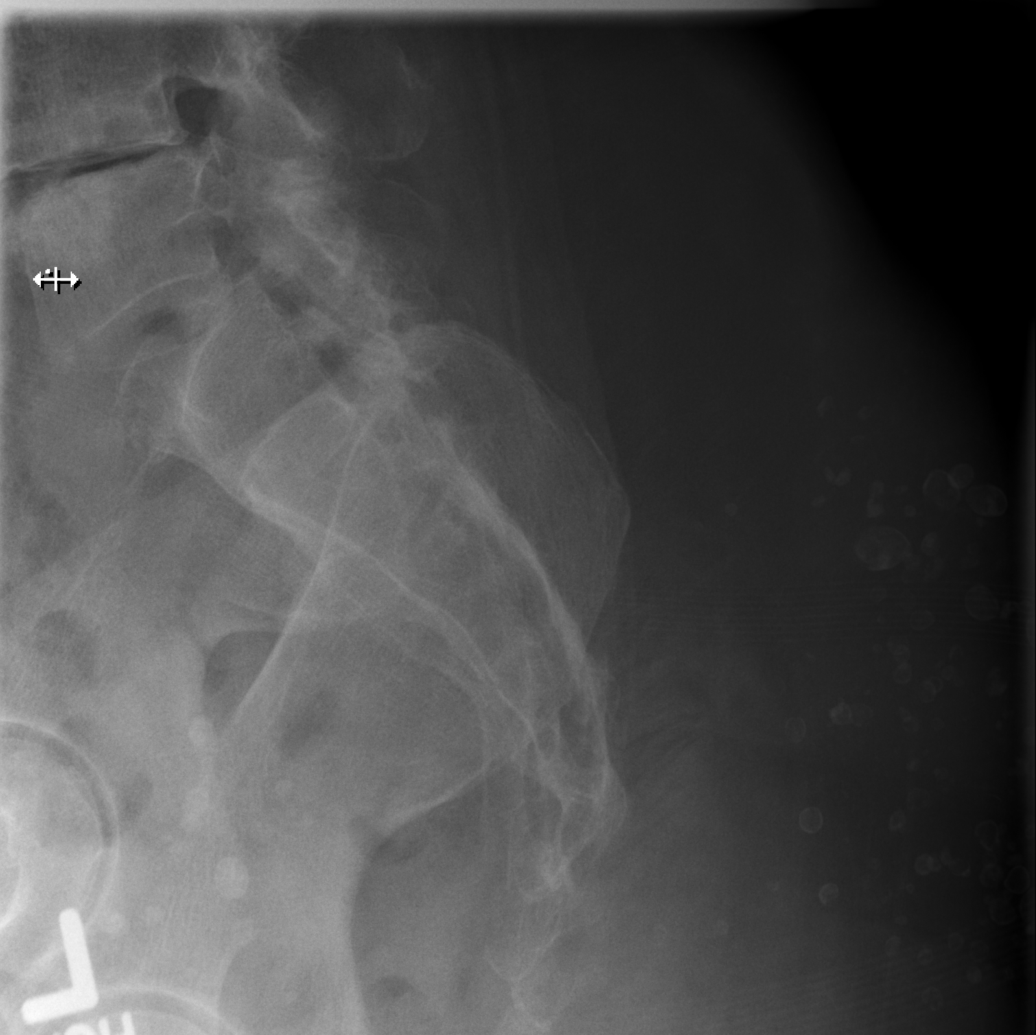

[7 of 7 positions shown; findings below may reference images not displayed]

FINDINGS: No fracture is noted. Minimal grade 1 anterolisthesis of L4-5 is
noted secondary to degenerative disc disease at this level. Severe
degenerative disc disease is noted at L3-4.
IMPRESSION: Multilevel degenerative disc disease. No acute abnormality seen in
the lumbar spine.

## 2014-10-24 IMAGING — CR DG KNEE COMPLETE 4+V*L*
4 series · 4 of 4 positions shown · non-contrast
Comparison: None.

CLINICAL DATA: Knee pain for 3 days.

EXAM:
LEFT KNEE - COMPLETE 4+ VIEW

[t knee lat left]
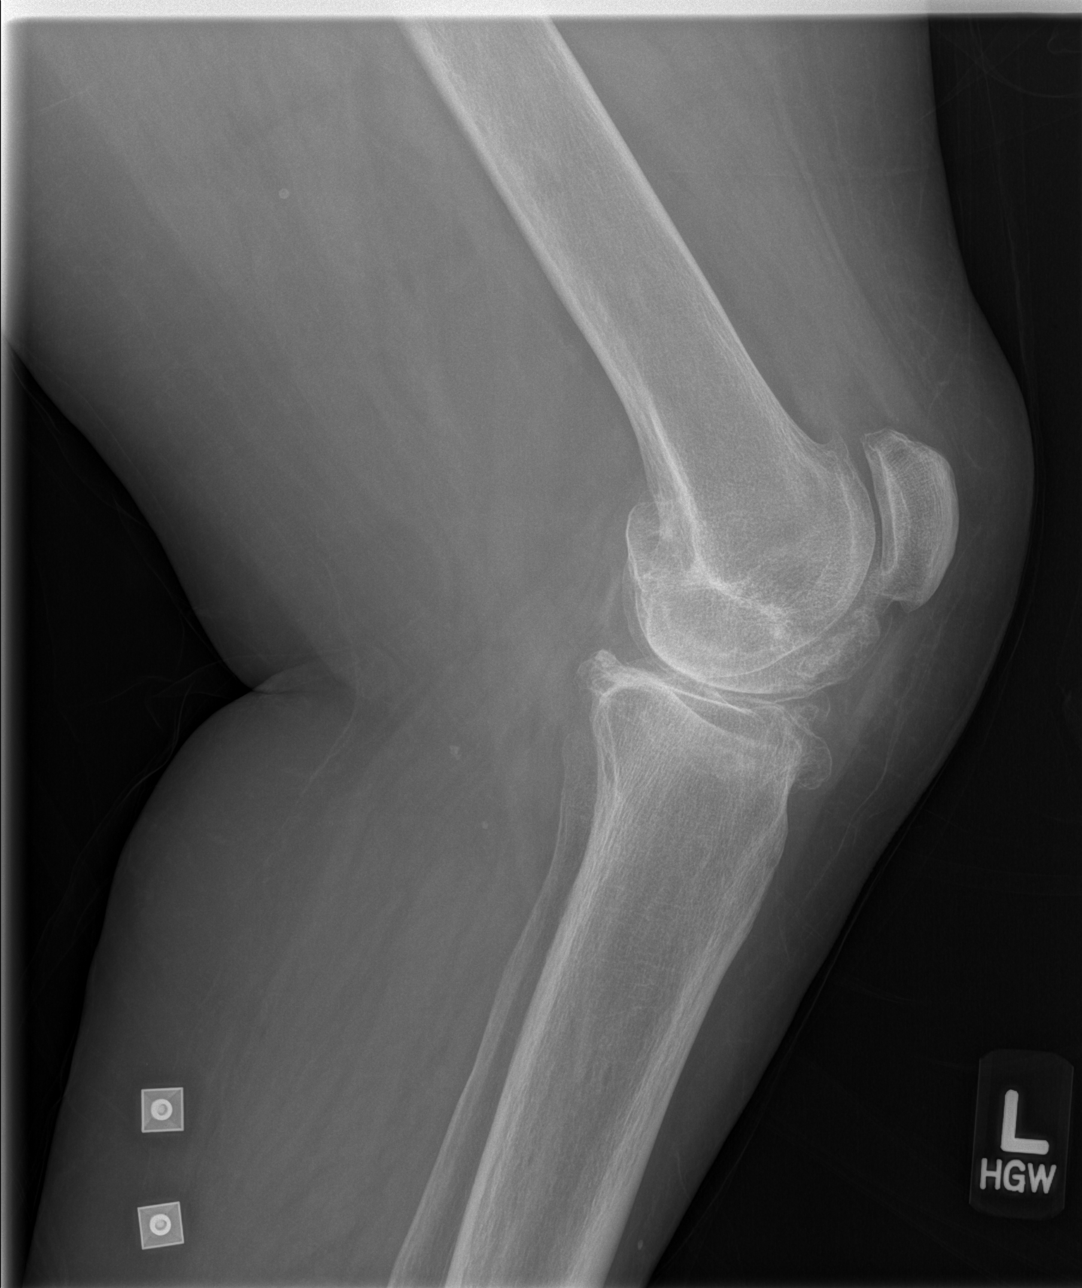

[t knee ap left]
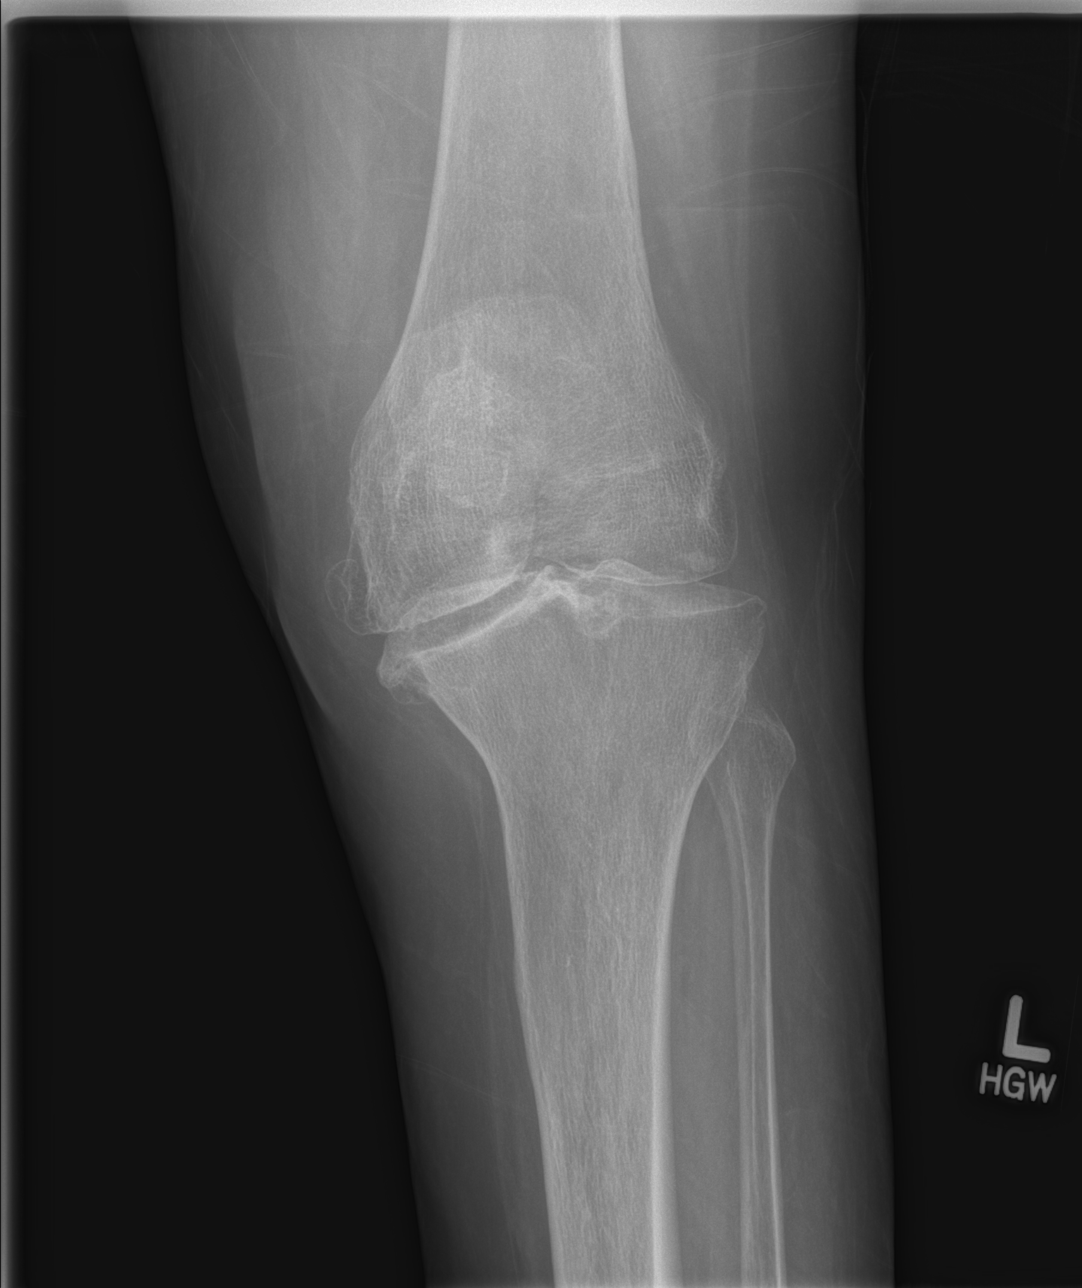

[t knee obl left (1 of 2)]
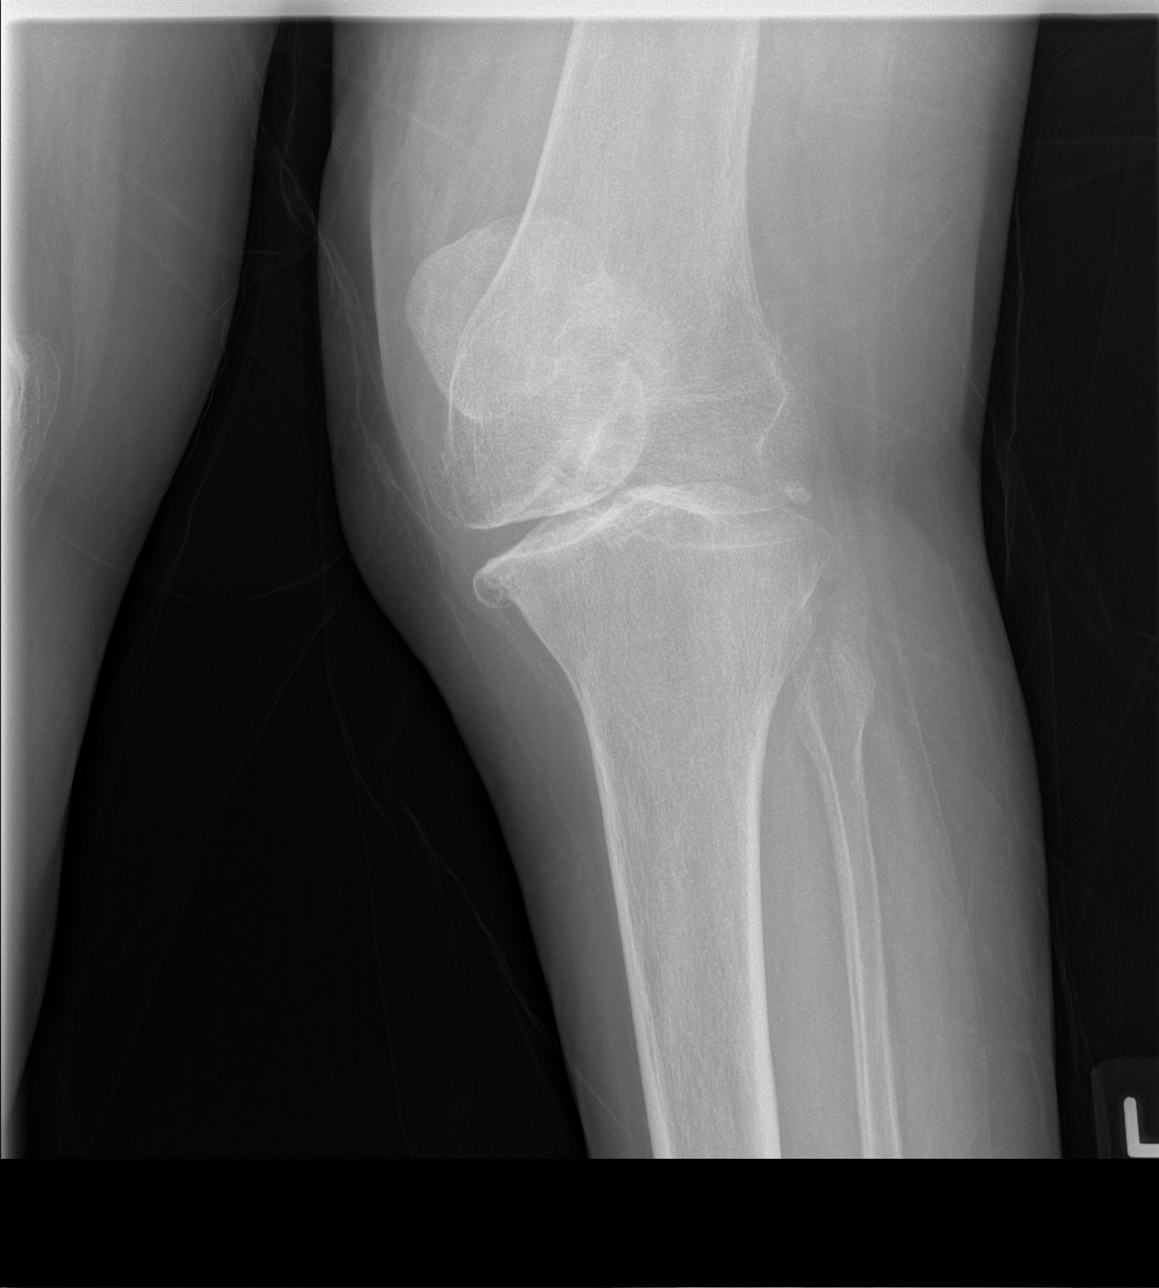

[t knee obl left (2 of 2)]
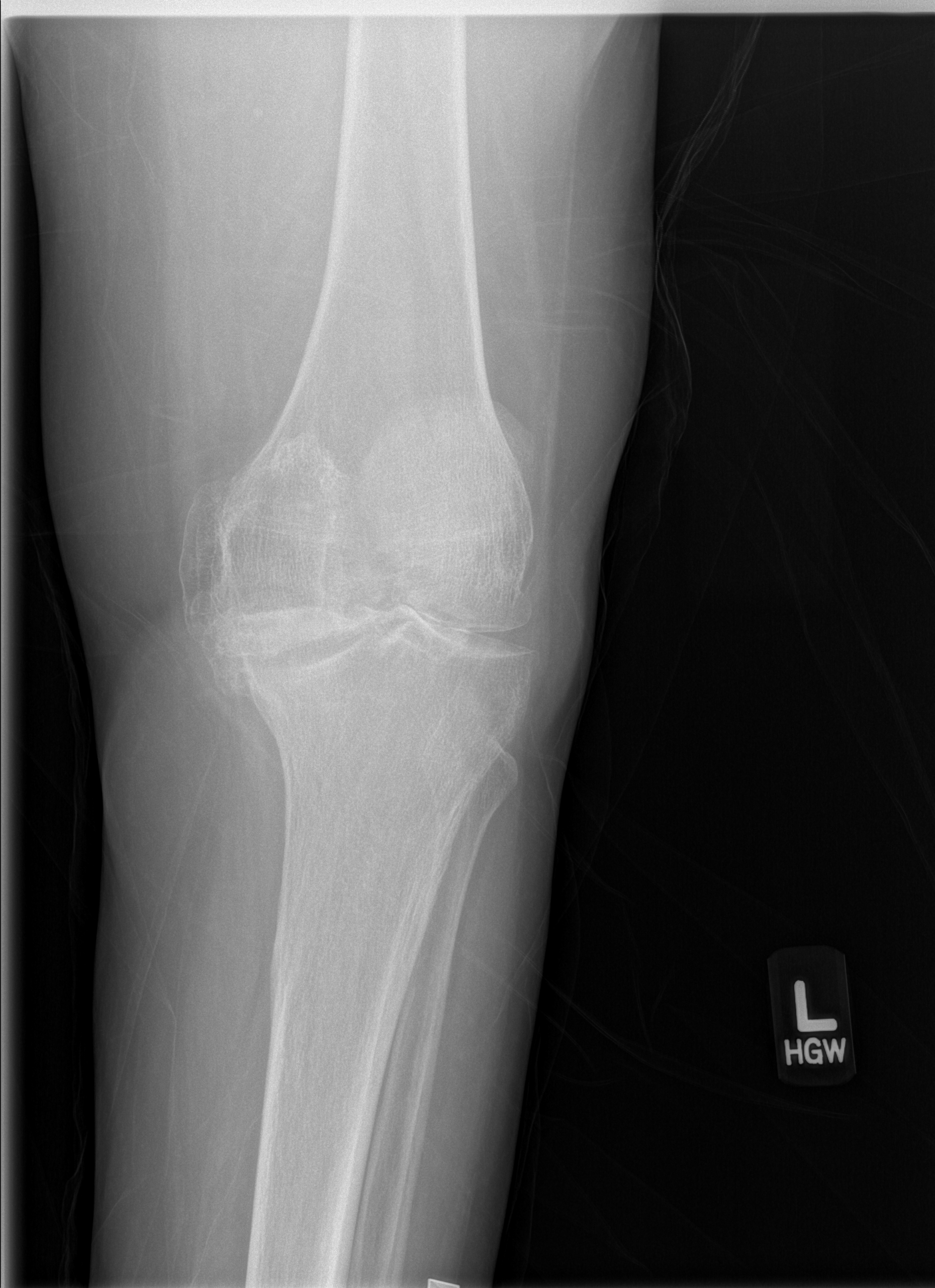

[4 of 4 positions shown; findings below may reference images not displayed]

FINDINGS: The bones appear osteopenic. No acute fracture, dislocation, or knee
joint effusion is identified. There is tricompartmental
osteophytosis, with greatest involvement of the medial compartment.
There is mild tricompartmental joint space narrowing.
IMPRESSION: No acute osseous abnormality identified. Moderate to advanced
tricompartmental osteoarthrosis.

## 2015-11-16 ENCOUNTER — Encounter: Payer: Self-pay | Admitting: Internal Medicine

## 2015-11-16 ENCOUNTER — Ambulatory Visit: Payer: Self-pay | Attending: Internal Medicine | Admitting: Internal Medicine

## 2015-11-16 VITALS — BP 118/70 | HR 82 | Temp 98.1°F | Resp 18 | Ht 67.0 in | Wt 181.4 lb

## 2015-11-16 DIAGNOSIS — K5901 Slow transit constipation: Secondary | ICD-10-CM | POA: Insufficient documentation

## 2015-11-16 DIAGNOSIS — M545 Low back pain, unspecified: Secondary | ICD-10-CM

## 2015-11-16 DIAGNOSIS — I10 Essential (primary) hypertension: Secondary | ICD-10-CM | POA: Insufficient documentation

## 2015-11-16 DIAGNOSIS — R350 Frequency of micturition: Secondary | ICD-10-CM | POA: Insufficient documentation

## 2015-11-16 DIAGNOSIS — N390 Urinary tract infection, site not specified: Secondary | ICD-10-CM | POA: Insufficient documentation

## 2015-11-16 DIAGNOSIS — M170 Bilateral primary osteoarthritis of knee: Secondary | ICD-10-CM | POA: Insufficient documentation

## 2015-11-16 DIAGNOSIS — Z79899 Other long term (current) drug therapy: Secondary | ICD-10-CM | POA: Insufficient documentation

## 2015-11-16 LAB — POCT URINALYSIS DIPSTICK
Bilirubin, UA: NEGATIVE
Glucose, UA: NEGATIVE
KETONES UA: NEGATIVE
Leukocytes, UA: NEGATIVE
Nitrite, UA: NEGATIVE
PH UA: 5.5
PROTEIN UA: NEGATIVE
RBC UA: NEGATIVE
Spec Grav, UA: <=1.005
Urobilinogen, UA: 0.2

## 2015-11-16 MED ORDER — TRAMADOL HCL 50 MG PO TABS: 50.0000 mg | ORAL_TABLET | Freq: Two times a day (BID) | ORAL | 0 refills | Status: AC | PRN

## 2015-11-16 MED ORDER — BISACODYL 5 MG PO TBEC
10.0000 mg | DELAYED_RELEASE_TABLET | Freq: Every day | ORAL | 0 refills | Status: AC | PRN
Start: 2015-11-16 — End: ?

## 2015-11-16 NOTE — Progress Notes (Signed)
Pamela Rocha, is a 70 y.o. female  QJF:[id number]  SLH:[id number]  DOB - 1945-12-09  Chief Complaint  Patient presents with  . Urinary Tract Infection        Subjective:   Pamela Rocha is a 69 y.o. female with history of HTN., OA both knees and chronic DJD of the spine here today for a sick visit. She was brought in by her daughter with major complaints of increased urine frequency, and one episode of blood in urine. Although the daughter is not sure if the blood is from the rectum or from the urethra, patient would not answer the question of where she thinks the blood was from. Patient also complained of ongoing low back pain. She usually takes Tramadol. There's been no bowel or urinary incontinence, fever, chills, nausea, vomiting, weakness, or dizziness. Patient has been constipated for about 3 days now. She is visiting her daughter from [place] and her PCP and other healthcare professionals are there, she will be going back within the next week. No recent fall. Patient has No headache, No chest pain, No abdominal pain - No Nausea, No new weakness tingling or numbness, No Cough - SOB. Patient was said to have colonoscopy recently in [place] per patient's daughter.  Problem  Frequency of Micturition  Primary Osteoarthritis of Both Knees  Slow Transit Constipation  Midline Low Back Pain Without Sciatica    ALLERGIES: No Known Allergies  PAST MEDICAL HISTORY: Past Medical History:  Diagnosis Date  . Arthritis   . Hypertension     MEDICATIONS AT HOME: Prior to Admission medications   Medication Sig Start Date End Date Taking? Authorizing Provider  amLODipine (NORVASC) 10 MG tablet Take 1 tablet (10 mg total) by mouth daily. 10/19/13  Yes Doris Cheadle, MD  losartan-hydrochlorothiazide (HYZAAR) 50-12.5 MG per tablet Take 1 tablet by mouth daily. 10/04/13  Yes Mancel Bale, MD  bisacodyl (DULCOLAX) 5 MG EC tablet Take 2 tablets (10 mg total) by mouth daily as needed  for moderate constipation. 11/16/15   Quentin Angst, MD  traMADol (ULTRAM) 50 MG tablet Take 1 tablet (50 mg total) by mouth every 12 (twelve) hours as needed for moderate pain. 11/16/15   Quentin Angst, MD     Objective:   Vitals:   11/16/15 1044  BP: 118/70  Pulse: 82  Resp: 18  Temp: 98.1 F (36.7 C)  TempSrc: Oral  SpO2: 97%  Weight: 181 lb 6.4 oz (82.3 kg)  Height: 5\' 7"  (1.702 m)   Exam General appearance : Elderly woman, Awake, alert, not in any distress. Speech Clear. Not toxic looking HEENT: Atraumatic and Normocephalic, pupils equally reactive to light and accomodation Neck: Supple, no JVD. No cervical lymphadenopathy.  Chest: Good air entry bilaterally, no added sounds  CVS: S1 S2 regular, no murmurs.  Abdomen: Bowel sounds present, Non tender and not distended with no gaurding, rigidity or rebound. Extremities: B/L Lower Ext shows no edema, both legs are warm to touch Neurology: Awake alert, and oriented X 3, CN II-XII intact, Non focal Skin: No Rash  Data Review No results found for: HGBA1C  Assessment & Plan   1. Frequency of micturition  - POCT urinalysis dipstick: No evidence of UTI, no blood in urine - Blood may have been from the rectum as a result of constipation. Patient will follow up with GI specialist in [place], meanwhile, will give stool softener. High fibre diet advised.  - POCT A1C  2. Midline low back pain without sciatica  -  traMADol (ULTRAM) 50 MG tablet; Take 1 tablet (50 mg total) by mouth every 12 (twelve) hours as needed for moderate pain.  Dispense: 60 tablet; Refill: 0  3. Primary osteoarthritis of both knees  - traMADol (ULTRAM) 50 MG tablet; Take 1 tablet (50 mg total) by mouth every 12 (twelve) hours as needed for moderate pain.  Dispense: 60 tablet; Refill: 0  4. Slow transit constipation  - bisacodyl (DULCOLAX) 5 MG EC tablet; Take 2 tablets (10 mg total) by mouth daily as needed for moderate constipation.   Dispense: 30 tablet; Refill: 0  Patient have been counseled extensively about nutrition and exercise  Return in about 6 months (around 05/18/2016) for Follow up HTN, Follow up Pain and comorbidities.  Interpreter was used to communicate directly with patient for the entire encounter including providing detailed patient instructions.   The patient was given clear instructions to go to ER or return to medical center if symptoms don't improve, worsen or new problems develop. The patient verbalized understanding. The patient was told to call to get lab results if they haven't heard anything in the next week.   This note has been created with Education officer, environmental. Any transcriptional errors are unintentional.    Jeanann Lewandowsky, MD, MHA, Maxwell Caul, CPE [facility] [place], [place] [phone]   11/16/2015, 11:27 AM

## 2015-11-16 NOTE — Progress Notes (Signed)
Patient is here for UTI symptoms  Patient complains of Increase urine with blood noticed. Patient suffers from GI concerns and states blood could be from rectum.  Patient has taken medication and patient has eaten today.

## 2015-11-17 MED FILL — traMADol HCL 50 MG TABS: 50 | 30 days supply | Qty: 60 | Fill #0

## 2016-02-23 ENCOUNTER — Other Ambulatory Visit: Payer: Self-pay | Admitting: Internal Medicine

## 2016-02-23 DIAGNOSIS — I10 Essential (primary) hypertension: Secondary | ICD-10-CM

## 2016-02-23 MED ORDER — LOSARTAN POTASSIUM-HCTZ 50-12.5 MG PO TABS: 1.0000 | ORAL_TABLET | Freq: Every day | ORAL | 3 refills | Status: DC

## 2016-02-23 MED ORDER — AMLODIPINE BESYLATE 10 MG PO TABS: 10.0000 mg | ORAL_TABLET | Freq: Every day | ORAL | 3 refills | Status: DC

## 2016-02-23 MED ORDER — METFORMIN HCL 500 MG PO TABS: 500.0000 mg | ORAL_TABLET | Freq: Two times a day (BID) | ORAL | 3 refills | Status: DC

## 2016-02-23 MED FILL — AMLODIPINE BESYLATE 10 MG T: 10 | 30 days supply | Qty: 30 | Fill #0

## 2016-02-23 MED FILL — LOSARTAN-HCTZ 50-12.5 MG TA: 50-12.5 | 30 days supply | Qty: 30 | Fill #0

## 2016-02-23 MED FILL — metFORMIN HCL 500 MG TABS: 500 | 90 days supply | Qty: 180 | Fill #0

## 2016-02-26 MED FILL — AMLODIPINE BESYLATE 10 MG T: 10 | 60 days supply | Qty: 60 | Fill #1

## 2016-02-26 MED FILL — LOSARTAN-HCTZ 50-12.5 MG TA: 50-12.5 | 90 days supply | Qty: 90 | Fill #1

## 2017-12-17 ENCOUNTER — Other Ambulatory Visit: Payer: Self-pay | Admitting: Internal Medicine

## 2017-12-17 DIAGNOSIS — I10 Essential (primary) hypertension: Secondary | ICD-10-CM

## 2017-12-17 MED ORDER — METFORMIN HCL 500 MG PO TABS: 500.0000 mg | ORAL_TABLET | Freq: Two times a day (BID) | ORAL | 1 refills | Status: AC

## 2017-12-17 MED ORDER — AMLODIPINE BESYLATE 10 MG PO TABS: 10.0000 mg | ORAL_TABLET | Freq: Every day | ORAL | 1 refills | Status: AC

## 2017-12-17 MED ORDER — LOSARTAN POTASSIUM-HCTZ 50-12.5 MG PO TABS: 1.0000 | ORAL_TABLET | Freq: Every day | ORAL | 1 refills | Status: AC

## 2017-12-17 MED FILL — LOSARTAN-HCTZ 50-12.5 MG TA: 50-12.5 | 180 days supply | Qty: 180 | Fill #0

## 2017-12-17 MED FILL — metFORMIN HCL 500 MG TABS: 500 | 180 days supply | Qty: 360 | Fill #0

## 2017-12-18 MED FILL — AMLODIPINE BESYLATE 10 MG T: 10 | 180 days supply | Qty: 180 | Fill #0
